# Patient Record
Sex: Male | Born: 1995 | Race: White | Hispanic: No | Marital: Single | Smoking: Never smoker
Health system: Southern US, Community
[De-identification: ages and names within clinical notes are randomized; demographics above are authoritative.]

## PROBLEM LIST (undated history)

## (undated) DIAGNOSIS — H18601 Keratoconus, unspecified, right eye: Secondary | ICD-10-CM

---
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
## 2016-10-16 IMAGING — CR DG WRIST COMPLETE 3+V*R*
4 series · 4 of 4 positions shown · non-contrast
Comparison: CT right wrist of 10/30/2012 and right wrist plain
films of 09/17/2011

CLINICAL DATA: Wrist pain for 1 week, injured lifting

EXAM:
RIGHT WRIST - COMPLETE 3+ VIEW

[wrist pa]
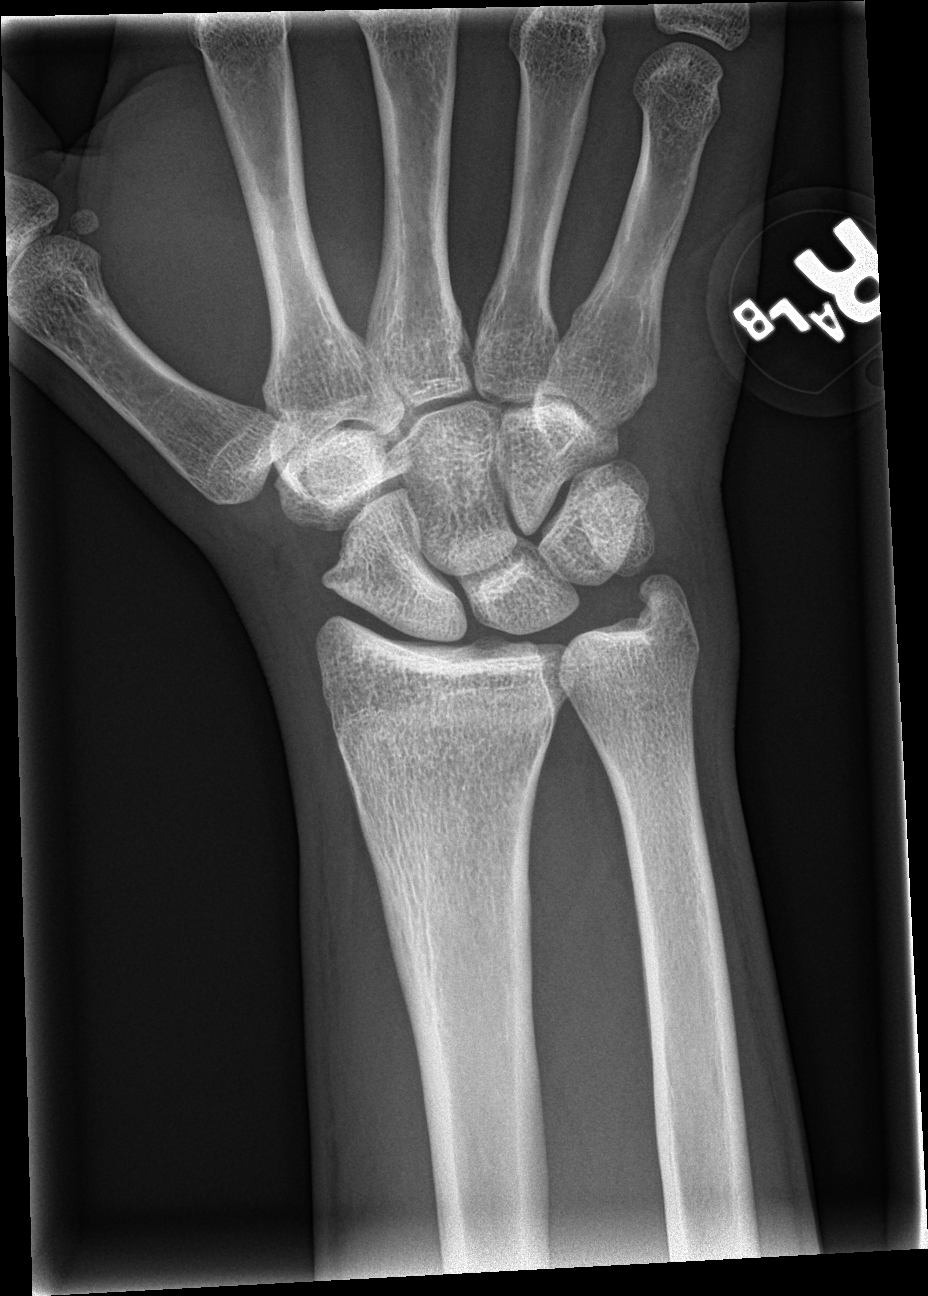

[wrist obl]
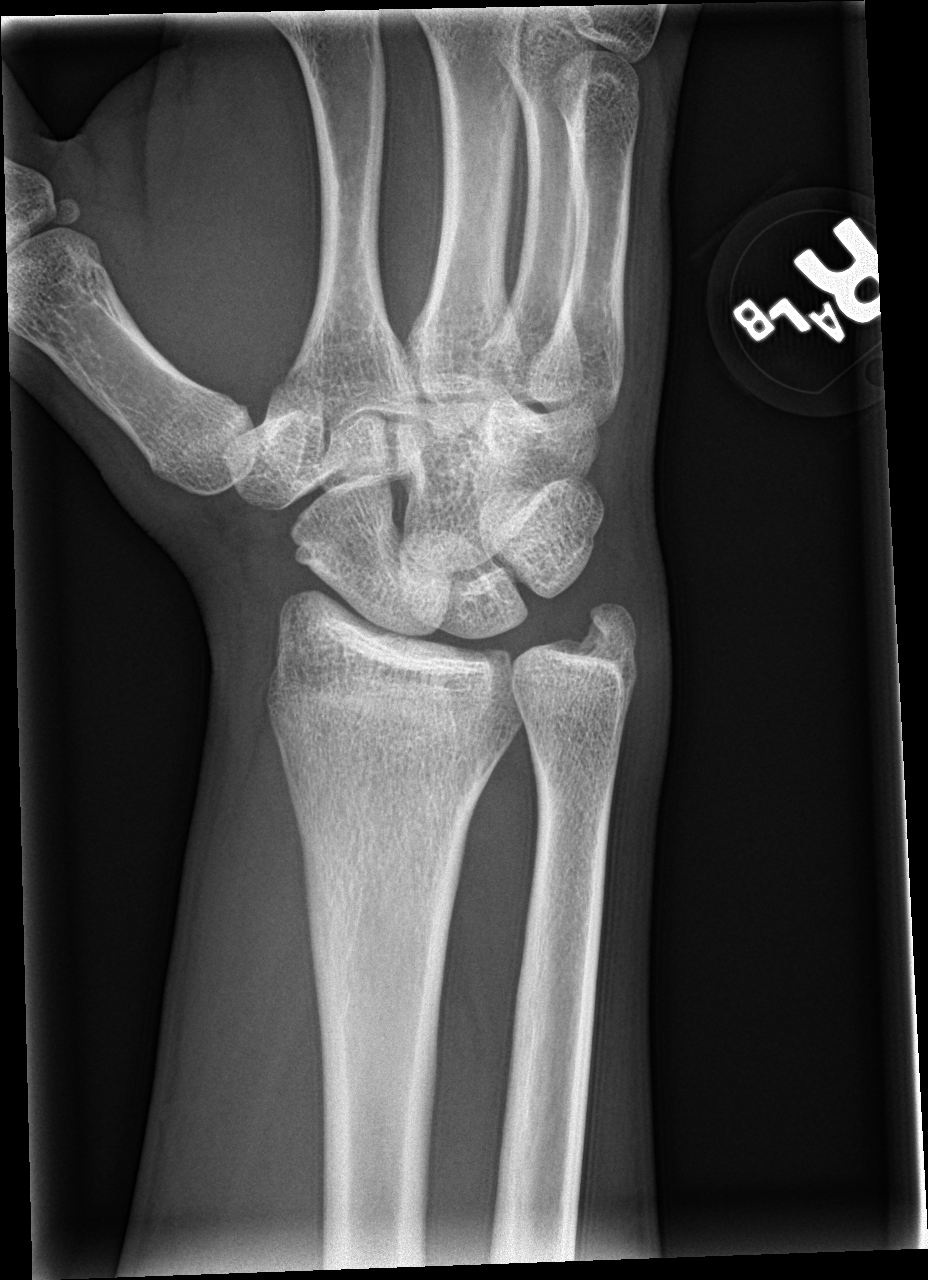

[wrist lat]
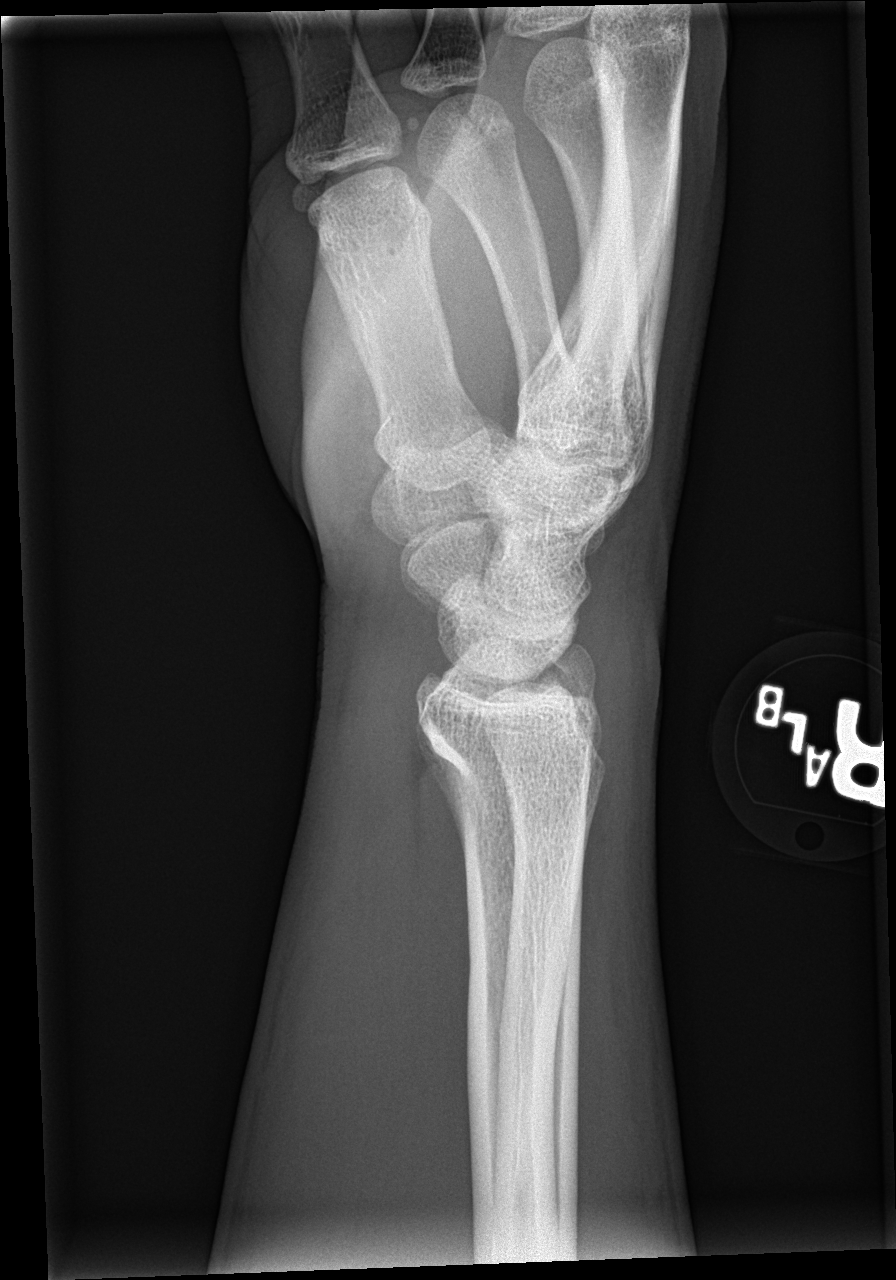

[wrist navicular]
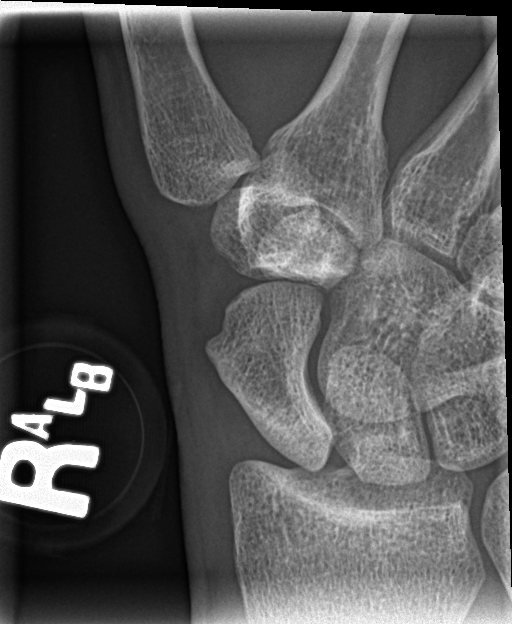

[4 of 4 positions shown; findings below may reference images not displayed]

FINDINGS: The previous fracture of the distal right radial metaphysis has
healed and there is fusion of the physis of the distal right radius.
The radiocarpal joint space appears normal. The ulnar styloid is
intact. The carpal bones are in normal position.
IMPRESSION: Healing of previous distal right radial metaphyseal fracture with
fusion of the physis. No malalignment.

## 2017-10-03 ENCOUNTER — Encounter: Payer: Self-pay | Admitting: *Deleted

## 2017-10-03 ENCOUNTER — Other Ambulatory Visit: Payer: Self-pay

## 2017-10-04 ENCOUNTER — Encounter: Payer: Self-pay | Admitting: Anesthesiology

## 2017-10-04 NOTE — Discharge Instructions (Signed)
T & A INSTRUCTION SHEET - [facility] °[facility] ° °CREIGHTON VAUGHT, MD °PAUL H. JUENGEL, MD  °P. SCOTT BENNETT °CHAPMAN MCQUEEN, MD ° °[address] [place], [place] [address] TEL. [phone] °[address] [place] [address] [phone] ° °INFORMATION SHEET FOR A TONSILLECTOMY AND ADENDOIDECTOMY ° °About Your Tonsils and Adenoids ° The tonsils and adenoids are normal body tissues that are part of our immune system.  They normally help to protect us against diseases that may enter our mouth and nose.  However, sometimes the tonsils and/or adenoids become too large and obstruct our breathing, especially at night. °  ° If either of these things happen it helps to remove the tonsils and adenoids in order to become healthier. The operation to remove the tonsils and adenoids is called a tonsillectomy and adenoidectomy. ° °The Location of Your Tonsils and Adenoids ° The tonsils are located in the back of the throat on both side and sit in a cradle of muscles. The adenoids are located in the roof of the mouth, behind the nose, and closely associated with the opening of the Eustachian tube to the ear. ° °Surgery on Tonsils and Adenoids ° A tonsillectomy and adenoidectomy is a short operation which takes about thirty minutes.  This includes being put to sleep and being awakened.  Tonsillectomies and adenoidectomies are performed at [facility] and may require observation period in the recovery room prior to going home. ° °Following the Operation for a Tonsillectomy ° A cautery machine is used to control bleeding.  Bleeding from a tonsillectomy and adenoidectomy is minimal and postoperatively the risk of bleeding is approximately four percent, although this rarely life threatening. ° ° ° °After your tonsillectomy and adenoidectomy post-op care at home: ° °1. Our patients are able to go home the same day.  You may be given prescriptions for pain  medications and antibiotics, if indicated. °2. It is extremely important to remember that fluid intake is of utmost importance after a tonsillectomy.  The amount that you drink must be maintained in the postoperative period.  A good indication of whether a child is getting enough fluid is whether his/her urine output is constant.  As long as children are urinating or wetting their diaper every 6 - 8 hours this is usually enough fluid intake.   °3. Although rare, this is a risk of some bleeding in the first ten days after surgery.  This is usually occurs between day five and nine postoperatively.  This risk of bleeding is approximately four percent.  If you or your child should have any bleeding you should remain calm and notify our office or go directly to the Emergency Room at [facility] where they will contact us. Our doctors are available seven days a week for notification.  We recommend sitting up quietly in a chair, place an ice pack on the front of the neck and spitting out the blood gently until we are able to contact you.  Adults should gargle gently with ice water and this may help stop the bleeding.  If the bleeding does not stop after a short time, i.e. 10 to 15 minutes, or seems to be increasing again, please contact us or go to the hospital.   °4. It is common for the pain to be worse at 5 - 7 days postoperatively.  This occurs because the “scab” is peeling off and the mucous membrane (skin of   the throat) is growing back where the tonsils were.   °5. It is common for a low-grade fever, less than 102, during the first week after a tonsillectomy and adenoidectomy.  It is usually due to not drinking enough liquids, and we suggest your use liquid Tylenol or the pain medicine with Tylenol prescribed in order to keep your temperature below 102.  Please follow the directions on the back of the bottle. °6. Do not take aspirin or any products that contain aspirin such as Bufferin, Anacin,  Ecotrin, aspirin gum, Goodies, BC headache powders, etc., after a T&A because it can promote bleeding.  Please check with our office before administering any other medication that may been prescribed by other doctors during the two week post-operative period. °7. If you happen to look in the mirror or into your child’s mouth you will see white/gray patches on the back of the throat.  This is what a scab looks like in the mouth and is normal after having a T&A.  It will disappear once the tonsil area heals completely. However, it may cause a noticeable odor, and this too will disappear with time.     °8. You or your child may experience ear pain after having a T&A.  This is called referred pain and comes from the throat, but it is felt in the ears.  Ear pain is quite common and expected.  It will usually go away after ten days.  There is usually nothing wrong with the ears, and it is primarily due to the healing area stimulating the nerve to the ear that runs along the side of the throat.  Use either the prescribed pain medicine or Tylenol as needed.  °9. The throat tissues after a tonsillectomy are obviously sensitive.  Smoking around children who have had a tonsillectomy significantly increases the risk of bleeding.  DO NOT SMOKE!  ° °General Anesthesia, Adult, Care After °These instructions provide you with information about caring for yourself after your procedure. Your health care provider may also give you more specific instructions. Your treatment has been planned according to current medical practices, but problems sometimes occur. Call your health care provider if you have any problems or questions after your procedure. °What can I expect after the procedure? °After the procedure, it is common to have: °· Vomiting. °· A sore throat. °· Mental slowness. ° °It is common to feel: °· Nauseous. °· Cold or shivery. °· Sleepy. °· Tired. °· Sore or achy, even in parts of your body where you did not have  surgery. ° °Follow these instructions at home: °For at least 24 hours after the procedure: °· Do not: °? Participate in activities where you could fall or become injured. °? Drive. °? Use heavy machinery. °? Drink alcohol. °? Take sleeping pills or medicines that cause drowsiness. °? Make important decisions or sign legal documents. °? Take care of children on your own. °· Rest. °Eating and drinking °· If you vomit, drink water, juice, or soup when you can drink without vomiting. °· Drink enough fluid to keep your urine clear or pale yellow. °· Make sure you have little or no nausea before eating solid foods. °· Follow the diet recommended by your health care provider. °General instructions °· Have a responsible adult stay with you until you are awake and alert. °· Return to your normal activities as told by your health care provider. Ask your health care provider what activities are safe for you. °· Take over-the-counter and   prescription medicines only as told by your health care provider. °· If you smoke, do not smoke without supervision. °· Keep all follow-up visits as told by your health care provider. This is important. °Contact a health care provider if: °· You continue to have nausea or vomiting at home, and medicines are not helpful. °· You cannot drink fluids or start eating again. °· You cannot urinate after 8-12 hours. °· You develop a skin rash. °· You have fever. °· You have increasing redness at the site of your procedure. °Get help right away if: °· You have difficulty breathing. °· You have chest pain. °· You have unexpected bleeding. °· You feel that you are having a life-threatening or urgent problem. °This information is not intended to replace advice given to you by your health care provider. Make sure you discuss any questions you have with your health care provider. °Document Released: 05/15/2000 Document Revised: 07/12/2015 Document Reviewed: 01/21/2015 °Elsevier Interactive Patient Education  © 2018 [employer] ° °

## 2017-10-11 ENCOUNTER — Ambulatory Visit: Payer: 59 | Admitting: Anesthesiology

## 2017-10-11 ENCOUNTER — Encounter
Admission: RE | Disposition: A | Discharge status: Home / Self Care | Payer: Self-pay | Source: Ambulatory Visit | Attending: Otolaryngology

## 2017-10-11 ENCOUNTER — Ambulatory Visit
Admission: RE | Admit: 2017-10-11 | Discharge: 2017-10-11 | Disposition: A | Discharge status: Home / Self Care | Payer: 59 | Source: Ambulatory Visit | Attending: Otolaryngology | Admitting: Otolaryngology

## 2017-10-11 DIAGNOSIS — J3501 Chronic tonsillitis: Secondary | ICD-10-CM | POA: Insufficient documentation

## 2017-10-11 DIAGNOSIS — J358 Other chronic diseases of tonsils and adenoids: Secondary | ICD-10-CM | POA: Diagnosis not present

## 2017-10-11 HISTORY — PX: TONSILLECTOMY: SHX5217

## 2017-10-11 SURGERY — TONSILLECTOMY
Anesthesia: General | Site: Throat | Wound class: Clean Contaminated

## 2017-10-11 MED ORDER — ACETAMINOPHEN 10 MG/ML IV SOLN
1000.0000 mg | Freq: Once | INTRAVENOUS | Status: AC
  Administered 2017-10-11: 1000 mg via INTRAVENOUS

## 2017-10-11 MED ORDER — OXYCODONE HCL 5 MG/5ML PO SOLN
5.0000 mg | Freq: Once | ORAL | Status: AC | PRN
  Administered 2017-10-11: 5 mg via ORAL

## 2017-10-11 MED ORDER — LIDOCAINE HCL (CARDIAC) PF 100 MG/5ML IV SOSY
PREFILLED_SYRINGE | INTRAVENOUS | Status: DC | PRN
  Administered 2017-10-11: 40 mg via INTRAVENOUS

## 2017-10-11 MED ORDER — OXYCODONE HCL 5 MG PO TABS: 5.0000 mg | ORAL_TABLET | Freq: Once | ORAL | Status: AC | PRN

## 2017-10-11 MED ORDER — GLYCOPYRROLATE 0.2 MG/ML IJ SOLN
INTRAMUSCULAR | Status: DC | PRN
  Administered 2017-10-11: 0.1 mg via INTRAVENOUS

## 2017-10-11 MED ORDER — ONDANSETRON HCL 4 MG/2ML IJ SOLN
INTRAMUSCULAR | Status: DC | PRN
  Administered 2017-10-11: 4 mg via INTRAVENOUS

## 2017-10-11 MED ORDER — ONDANSETRON HCL 4 MG/2ML IJ SOLN: 4.0000 mg | Freq: Once | INTRAMUSCULAR | Status: DC | PRN

## 2017-10-11 MED ORDER — MIDAZOLAM HCL 5 MG/5ML IJ SOLN
INTRAMUSCULAR | Status: DC | PRN
  Administered 2017-10-11: 2 mg via INTRAVENOUS

## 2017-10-11 MED ORDER — SUCCINYLCHOLINE CHLORIDE 20 MG/ML IJ SOLN
INTRAMUSCULAR | Status: DC | PRN
  Administered 2017-10-11: 100 mg via INTRAVENOUS

## 2017-10-11 MED ORDER — FENTANYL CITRATE (PF) 100 MCG/2ML IJ SOLN
INTRAMUSCULAR | Status: DC | PRN
  Administered 2017-10-11: 100 ug via INTRAVENOUS

## 2017-10-11 MED ORDER — FENTANYL CITRATE (PF) 100 MCG/2ML IJ SOLN: 25.0000 ug | INTRAMUSCULAR | Status: DC | PRN

## 2017-10-11 MED ORDER — PROPOFOL 10 MG/ML IV BOLUS
INTRAVENOUS | Status: DC | PRN
  Administered 2017-10-11: 180 mg via INTRAVENOUS

## 2017-10-11 MED ORDER — LIDOCAINE HCL 4 % MT SOLN
OROMUCOSAL | Status: DC | PRN
  Administered 2017-10-11: 4 mL via TOPICAL

## 2017-10-11 MED ORDER — DEXAMETHASONE SODIUM PHOSPHATE 4 MG/ML IJ SOLN
INTRAMUSCULAR | Status: DC | PRN
  Administered 2017-10-11: 10 mg via INTRAVENOUS

## 2017-10-11 MED ORDER — LACTATED RINGERS IV SOLN
INTRAVENOUS | Status: DC
  Administered 2017-10-11: 07:00:00 via INTRAVENOUS

## 2017-10-11 SURGICAL SUPPLY — 13 items
BLADE BOVIE TIP EXT 4 (BLADE) ×3 IMPLANT
CANISTER SUCT 1200ML W/VALVE (MISCELLANEOUS) ×3 IMPLANT
ELECT REM PT RETURN 9FT ADLT (ELECTROSURGICAL) ×3
ELECTRODE REM PT RTRN 9FT ADLT (ELECTROSURGICAL) ×1 IMPLANT
GLOVE PI ULTRA LF STRL 7.5 (GLOVE) ×1 IMPLANT
GLOVE PI ULTRA NON LATEX 7.5 (GLOVE) ×2
KIT TURNOVER KIT A (KITS) ×3 IMPLANT
PACK TONSIL/ADENOIDS (PACKS) ×3 IMPLANT
PENCIL SMOKE EVACUATOR (MISCELLANEOUS) ×3 IMPLANT
SLEEVE SUCTION 125 (MISCELLANEOUS) ×3 IMPLANT
SOL ANTI-FOG 6CC FOG-OUT (MISCELLANEOUS) ×1 IMPLANT
SOL FOG-OUT ANTI-FOG 6CC (MISCELLANEOUS) ×2
STRAP BODY AND KNEE 60X3 (MISCELLANEOUS) ×3 IMPLANT

## 2017-10-11 NOTE — Anesthesia Procedure Notes (Signed)
Procedure Name: Intubation Date/Time: 10/11/2017 7:43 AM Performed by: Jimmy PicketAmyot, Judith Demps, CRNA Pre-anesthesia Checklist: Patient identified, Emergency Drugs available, Suction available, Patient being monitored and Timeout performed Patient Re-evaluated:Patient Re-evaluated prior to induction Oxygen Delivery Method: Circle system utilized Preoxygenation: Pre-oxygenation with 100% oxygen Induction Type: IV induction Ventilation: Mask ventilation without difficulty Laryngoscope Size: Miller and 3 Grade View: Grade I Tube type: Oral Rae Tube size: 7.5 mm Number of attempts: 1 Placement Confirmation: ETT inserted through vocal cords under direct vision,  positive ETCO2 and breath sounds checked- equal and bilateral Tube secured with: Tape Dental Injury: Teeth and Oropharynx as per pre-operative assessment

## 2017-10-11 NOTE — Anesthesia Preprocedure Evaluation (Signed)
Anesthesia Evaluation  Patient identified by MRN, date of birth, ID band Patient awake    Reviewed: Allergy & Precautions, H&P , NPO status , Patient's Chart, lab work & pertinent test results, reviewed documented beta blocker date and time   Airway Mallampati: II  TM Distance: >3 FB Neck ROM: full    Dental no notable dental hx.    Pulmonary neg pulmonary ROS,    Pulmonary exam normal breath sounds clear to auscultation       Cardiovascular Exercise Tolerance: Good negative cardio ROS   Rhythm:regular Rate:Normal     Neuro/Psych negative neurological ROS  negative psych ROS   GI/Hepatic negative GI ROS, Neg liver ROS,   Endo/Other  negative endocrine ROS  Renal/GU negative Renal ROS  negative genitourinary   Musculoskeletal   Abdominal   Peds  Hematology negative hematology ROS (+)   Anesthesia Other Findings   Reproductive/Obstetrics negative OB ROS                             Anesthesia Physical Anesthesia Plan  ASA: I  Anesthesia Plan: General   Post-op Pain Management:    Induction:   PONV Risk Score and Plan:   Airway Management Planned:   Additional Equipment:   Intra-op Plan:   Post-operative Plan:   Informed Consent: I have reviewed the patients History and Physical, chart, labs and discussed the procedure including the risks, benefits and alternatives for the proposed anesthesia with the patient or authorized representative who has indicated his/her understanding and acceptance.   Dental Advisory Given  Plan Discussed with: CRNA  Anesthesia Plan Comments:         Anesthesia Quick Evaluation  

## 2017-10-11 NOTE — H&P (Signed)
H&P has been reviewedand patient reevaluated,  and no changes necessary. To be downloaded later.  

## 2017-10-11 NOTE — Transfer of Care (Signed)
Immediate Anesthesia Transfer of Care Note  Patient: Stephen Hill  Procedure(s) Performed: TONSILLECTOMY (N/A Throat)  Patient Location: PACU  Anesthesia Type: General  Level of Consciousness: awake, alert  and patient cooperative  Airway and Oxygen Therapy: Patient Spontanous Breathing and Patient connected to supplemental oxygen  Post-op Assessment: Post-op Vital signs reviewed, Patient's Cardiovascular Status Stable, Respiratory Function Stable, Patent Airway and No signs of Nausea or vomiting  Post-op Vital Signs: Reviewed and stable  Complications: No apparent anesthesia complications

## 2017-10-11 NOTE — Op Note (Signed)
10/11/2017  8:05 AM    Stephen Hill, Stephen Hill  [id number]   Pre-Op Dx: Tonsillar lithiasis, chronic tonsillitis  Post-op Dx: Same  Proc: Tonsillectomy  Surg:  Beverly Sessionsaul H Johndavid Geralds  Anes:  GOT  EBL: 30 mL  Comp: None  Findings: Very cryptic tonsils with debris.   Procedure: Patient was brought to the operating room and placed in supine position.  He was given general anesthesia by oral endotracheal intubation.  A Vernelle EmeraldDavis mouthgag was used to visualize the oropharynx.  His tonsils were hiding behind the anterior tonsillar pillars and were quite cryptic with debris on both sides.  The soft palate was retracted and the adenoid tissue was completely shrunk down and the nasopharynx was flat.  He did not need anything done with adenoids.  The left tonsil was grasped and pulled medially.  The anterior pillar was incised with electrocautery.  The tonsil was dissected from its fossa using blunt dissection and electrocautery.  Vessels were cauterized as they were cut.  Bleeding was controlled with direct pressure and electrocautery.  This was then repeated on the opposite side in a similar fashion.  There is no further bleeding noted.  A soft catheter was placed on the stomach to suction this out and there is no significant residue in the stomach at all.  The patient tolerated the procedure well.  He was awakened and taken to the recovery room in satisfactory condition.  There were no operative complications.  Dispo:   To PACU to be discharged home  Plan: To follow-up in the office in a couple weeks to make sure he is doing well.  He will push fluids and use protein shakes to keep his nutrition up.  I will give him some Tylenol with codeine liquid to use for pain, which she can supplement with some Tylenol.  Beverly Sessionsaul H Mylin Hirano  10/11/2017 8:05 AM

## 2017-10-11 NOTE — Anesthesia Postprocedure Evaluation (Signed)
Anesthesia Post Note  Patient: Stephen Hill  Procedure(s) Performed: TONSILLECTOMY (N/A Throat)  Patient location during evaluation: PACU Anesthesia Type: General Level of consciousness: awake and alert Pain management: pain level controlled Vital Signs Assessment: post-procedure vital signs reviewed and stable Respiratory status: spontaneous breathing, nonlabored ventilation, respiratory function stable and patient connected to nasal cannula oxygen Cardiovascular status: blood pressure returned to baseline and stable Postop Assessment: no apparent nausea or vomiting Anesthetic complications: no    Scarlette Sliceachel B Shizue Kaseman

## 2017-10-12 ENCOUNTER — Encounter: Payer: Self-pay | Admitting: Otolaryngology

## 2017-10-15 LAB — SURGICAL PATHOLOGY

## 2021-01-14 ENCOUNTER — Other Ambulatory Visit: Payer: Self-pay

## 2021-01-14 ENCOUNTER — Ambulatory Visit
Admission: EM | Admit: 2021-01-14 | Discharge: 2021-01-14 | Disposition: A | Discharge status: Home / Self Care | Payer: BC Managed Care – PPO | Attending: Internal Medicine | Admitting: Internal Medicine

## 2021-01-14 DIAGNOSIS — S61218D Laceration without foreign body of other finger without damage to nail, subsequent encounter: Secondary | ICD-10-CM

## 2021-01-14 NOTE — ED Triage Notes (Signed)
Pt c/o finger laceration from a saw today around 4 pm.

## 2021-01-14 NOTE — Discharge Instructions (Signed)
Sutures need to come out in 10 days Watch for signs of infection like redness, hotness and worse pain.

## 2021-01-14 NOTE — ED Provider Notes (Signed)
[facility]    CSN: [id number] Arrival date & time: 01/14/21  1724      History   Chief Complaint Chief Complaint  Patient presents with   Finger Injury    HPI Stephen Hill is a 25 y.o. male who presents with laceration of L index finger with a saw 2 h ago. Believes his last TD is up to date. Wound was bleeding a lot.     Past Medical History:  Diagnosis Date   Keratoconus of right eye     There are no problems to display for this patient.   Past Surgical History:  Procedure Laterality Date   HAND TENDON SURGERY Right 07/15/2010   repair of tendons and nerves -[facility]   TONSILLECTOMY N/A 10/11/2017   Procedure: TONSILLECTOMY;  Surgeon: Vernie Murders, MD;  [facility];  Service: ENT;  Laterality: N/A;       Home Medications    Prior to Admission medications   Not on File    Family History Family History  Problem Relation Age of Onset   Hyperlipidemia Mother    Healthy Father     Social History Social History   Tobacco Use   Smoking status: Never   Smokeless tobacco: Never  Vaping Use   Vaping Use: Never used  Substance Use Topics   Alcohol use: Yes    Alcohol/week: 4.0 standard drinks    Types: 4 Cans of beer per week     Allergies   Patient has no known allergies.   Review of Systems Review of Systems  Skin:  Positive for wound. Negative for color change and pallor.  Neurological:  Negative for numbness.    Physical Exam Triage Vital Signs ED Triage Vitals  Enc Vitals Group     BP 01/14/21 1747 (!) 152/84     Pulse Rate 01/14/21 1747 77     Resp 01/14/21 1747 16     Temp 01/14/21 1747 98.7 F (37.1 C)     Temp src --      SpO2 01/14/21 1747 100 %     Weight 01/14/21 1750 155 lb (70.3 kg)     Height 01/14/21 1750 6' (1.829 m)     Head Circumference --      Peak Flow --      Pain Score 01/14/21 1750 3     Pain Loc --      Pain Edu? --      Excl. in GC? --    No data found.  Updated Vital  Signs BP (!) 152/84 (BP Location: Right Arm)   Pulse 77   Temp 98.7 F (37.1 C)   Resp 16   Ht 6' (1.829 m)   Wt 155 lb (70.3 kg)   SpO2 100%   BMI 21.02 kg/m   Visual Acuity Right Eye Distance:   Left Eye Distance:   Bilateral Distance:    Right Eye Near:   Left Eye Near:    Bilateral Near:     Physical Exam Vitals and nursing note reviewed.  Constitutional:      General: He is not in acute distress.    Appearance: He is normal weight. He is not toxic-appearing.  HENT:     Right Ear: External ear normal.     Left Ear: External ear normal.  Eyes:     General: No scleral icterus.    Conjunctiva/sclera: Conjunctivae normal.  Pulmonary:     Effort: Pulmonary effort is normal.  Musculoskeletal:        General: Normal range of motion.     Cervical back: Neck supple.  Skin:    General: Skin is warm and dry.     Comments: L index with 2 parallel lacerations on volar distal finger of about 3 cm length, the top is deeper and edges are irregular, but wound is clean.   Neurological:     Mental Status: He is alert and oriented to person, place, and time.     Gait: Gait normal.  Psychiatric:        Mood and Affect: Mood normal.        Behavior: Behavior normal.        Thought Content: Thought content normal.        Judgment: Judgment normal.     UC Treatments / Results  Labs (all labs ordered are listed, but only abnormal results are displayed) Labs Reviewed - No data to display  EKG   Radiology No results found.  Procedures Laceration Repair  Date/Time: 01/14/2021 6:43 PM Performed by: Garey Ham, PA-C Authorized by: Garey Ham, PA-C   Consent:    Consent obtained:  Verbal   Consent given by:  Patient Universal protocol:    Procedure explained and questions answered to patient or proxy's satisfaction: yes     Patient identity confirmed:  Verbally with patient Anesthesia:    Anesthesia method:  Local infiltration    Local anesthetic:  Lidocaine 1% w/o epi (2 ml) Laceration details:    Location:  Finger   Finger location:  L index finger   Length (cm):  3 Pre-procedure details:    Preparation:  Patient was prepped and draped in usual sterile fashion Exploration:    Imaging outcome: foreign body not noted     Wound exploration: entire depth of wound visualized     Wound extent: areolar tissue violated     Wound extent: no foreign bodies/material noted, no muscle damage noted, no nerve damage noted, no tendon damage noted and no vascular damage noted     Contaminated: no   Treatment:    Area cleansed with:  Saline and povidone-iodine   Amount of cleaning:  Extensive   Irrigation solution:  Sterile water   Irrigation method:  Syringe (20 ml)   Visualized foreign bodies/material removed: no     Debridement:  Moderate   Undermining:  None Skin repair:    Repair method:  Sutures   Suture size:  4-0   Suture material:  Prolene   Suture technique:  Simple interrupted   Number of sutures:  4 Approximation:    Approximation:  Close Repair type:    Repair type:  Simple Post-procedure details:    Dressing:  Antibiotic ointment, non-adherent dressing and tube gauze   Procedure completion:  Tolerated well, no immediate complications Comments:     The irregular borders of the edges of the laceration were trimmed off.  (including critical care time)  Medications Ordered in UC Medications - No data to display  Initial Impression / Assessment and Plan / UC Course  I have reviewed the triage vital signs and the nursing notes. Laceration of L index finger Wound care instructions reviewed Sutures need to be removed in 10 days      Final Clinical Impressions(s) / UC Diagnoses   Final diagnoses:  Laceration of index finger without foreign body without damage to nail, unspecified laterality, subsequent encounter     Discharge Instructions  Sutures need to come out in 10 days Watch for  signs of infection like redness, hotness and worse pain.      ED Prescriptions   None    PDMP not reviewed this encounter.   Garey Ham, PA-C 01/14/21 2030
